# Patient Record
Sex: Female | Born: 2000 | Race: White | Hispanic: No | Marital: Single | Smoking: Never smoker
Health system: Southern US, Community
[De-identification: ages and names within clinical notes are randomized; demographics above are authoritative.]

## PROBLEM LIST (undated history)

## (undated) DIAGNOSIS — N390 Urinary tract infection, site not specified: Secondary | ICD-10-CM

---
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
## 2021-07-01 ENCOUNTER — Encounter (HOSPITAL_COMMUNITY): Payer: Self-pay | Admitting: Emergency Medicine

## 2021-07-01 ENCOUNTER — Emergency Department (HOSPITAL_COMMUNITY)
Admission: EM | Admit: 2021-07-01 | Discharge: 2021-07-01 | Disposition: A | Discharge status: Home / Self Care | Payer: 59 | Attending: Emergency Medicine | Admitting: Emergency Medicine

## 2021-07-01 ENCOUNTER — Other Ambulatory Visit: Payer: Self-pay

## 2021-07-01 ENCOUNTER — Emergency Department (HOSPITAL_COMMUNITY): Payer: 59

## 2021-07-01 DIAGNOSIS — N201 Calculus of ureter: Secondary | ICD-10-CM | POA: Insufficient documentation

## 2021-07-01 DIAGNOSIS — R109 Unspecified abdominal pain: Secondary | ICD-10-CM | POA: Diagnosis present

## 2021-07-01 DIAGNOSIS — N200 Calculus of kidney: Secondary | ICD-10-CM

## 2021-07-01 LAB — CBC
HCT: 43.3 % (ref 36.0–46.0)
Hemoglobin: 14.5 g/dL (ref 12.0–15.0)
MCH: 31 pg (ref 26.0–34.0)
MCHC: 33.5 g/dL (ref 30.0–36.0)
MCV: 92.7 fL (ref 80.0–100.0)
Platelets: 334 10*3/uL (ref 150–400)
RBC: 4.67 MIL/uL (ref 3.87–5.11)
RDW: 11.9 % (ref 11.5–15.5)
WBC: 11.4 10*3/uL — ABNORMAL HIGH (ref 4.0–10.5)
nRBC: 0 % (ref 0.0–0.2)

## 2021-07-01 LAB — LIPASE, BLOOD: Lipase: 24 U/L (ref 11–51)

## 2021-07-01 LAB — URINALYSIS, ROUTINE W REFLEX MICROSCOPIC
Bilirubin Urine: NEGATIVE
Glucose, UA: NEGATIVE mg/dL
Ketones, ur: NEGATIVE mg/dL
Leukocytes,Ua: NEGATIVE
Nitrite: NEGATIVE
Protein, ur: 100 mg/dL — AB
RBC / HPF: >50 RBC/hpf — ABNORMAL HIGH (ref 0–5)
Specific Gravity, Urine: 1.03 (ref 1.005–1.030)
pH: 5 (ref 5.0–8.0)

## 2021-07-01 LAB — HEPATIC FUNCTION PANEL
ALT: 14 U/L (ref 0–44)
AST: 16 U/L (ref 15–41)
Albumin: 4.3 g/dL (ref 3.5–5.0)
Alkaline Phosphatase: 88 U/L (ref 38–126)
Bilirubin, Direct: <0.1 mg/dL (ref 0.0–0.2)
Total Bilirubin: 0.5 mg/dL (ref 0.3–1.2)
Total Protein: 7.4 g/dL (ref 6.5–8.1)

## 2021-07-01 LAB — I-STAT BETA HCG BLOOD, ED (MC, WL, AP ONLY): I-stat hCG, quantitative: <5 m[IU]/mL (ref <5)

## 2021-07-01 LAB — BASIC METABOLIC PANEL
Anion gap: 12 (ref 5–15)
BUN: 9 mg/dL (ref 6–20)
CO2: 19 mmol/L — ABNORMAL LOW (ref 22–32)
Calcium: 9.6 mg/dL (ref 8.9–10.3)
Chloride: 106 mmol/L (ref 98–111)
Creatinine, Ser: 0.73 mg/dL (ref 0.44–1.00)
GFR, Estimated: >60 mL/min (ref >60)
Glucose, Bld: 153 mg/dL — ABNORMAL HIGH (ref 70–99)
Potassium: 3.5 mmol/L (ref 3.5–5.1)
Sodium: 137 mmol/L (ref 135–145)

## 2021-07-01 MED ORDER — MORPHINE SULFATE (PF) 4 MG/ML IV SOLN
4.0000 mg | Freq: Once | INTRAVENOUS | Status: AC
  Administered 2021-07-01: 09:00:00 4 mg via INTRAVENOUS
  Filled 2021-07-01: qty 1

## 2021-07-01 MED ORDER — KETOROLAC TROMETHAMINE 30 MG/ML IJ SOLN
30.0000 mg | Freq: Once | INTRAMUSCULAR | Status: AC
  Administered 2021-07-01: 11:00:00 30 mg via INTRAVENOUS
  Filled 2021-07-01: qty 1

## 2021-07-01 MED ORDER — ONDANSETRON 8 MG PO TBDP: 8.0000 mg | ORAL_TABLET | Freq: Three times a day (TID) | ORAL | 0 refills | Status: AC | PRN

## 2021-07-01 MED ORDER — NAPROXEN 375 MG PO TABS: 375.0000 mg | ORAL_TABLET | Freq: Two times a day (BID) | ORAL | 0 refills | Status: AC

## 2021-07-01 MED ORDER — ONDANSETRON HCL 4 MG/2ML IJ SOLN
4.0000 mg | Freq: Once | INTRAMUSCULAR | Status: AC
  Administered 2021-07-01: 09:00:00 4 mg via INTRAVENOUS
  Filled 2021-07-01: qty 2

## 2021-07-01 MED ORDER — HYDROCODONE-ACETAMINOPHEN 5-325 MG PO TABS: 1.0000 | ORAL_TABLET | Freq: Four times a day (QID) | ORAL | 0 refills | Status: AC | PRN

## 2021-07-01 NOTE — Discharge Instructions (Signed)
Use a urine strainer to help determine if you passed the kidney stone.  Take the medications as needed for pain and nausea.  Return to the emergency room for worsening symptoms.  Follow-up with a urologist for further treatment.

## 2021-07-01 NOTE — ED Triage Notes (Signed)
Pt reports sudden onset of right flank pain this AM. Reports hx of kidney stones. Appears uncomfortable. Vomiting in triage.

## 2021-07-01 NOTE — ED Provider Notes (Signed)
[facility] EMERGENCY DEPARTMENT Provider Note   CSN: [id number] Arrival date & time: 07/01/21  0809     History Chief Complaint  Patient presents with   Flank Pain    Katherine Potter is a 20 y.o. female.   Flank Pain   Patient presents with acute right-sided flank pain that started this morning.  Patient states she is having severe pain that is worse than she has had in the past.  Pain moves towards her abdomen.  She is having nausea and vomiting.  She also has felt chilled although has not measured any fevers.  Patient states he has a history of kidney stones and this does feel similar.  She has had no prior abdominal problems.  Past Medical History:  Diagnosis Date   UTI (urinary tract infection)     There are no problems to display for this patient.   History reviewed. No pertinent surgical history.   OB History   No obstetric history on file.     History reviewed. No pertinent family history.  Social History   Tobacco Use   Smoking status: Never   Smokeless tobacco: Never  Substance Use Topics   Alcohol use: Yes    Home Medications Prior to Admission medications   Medication Sig Start Date End Date Taking? Authorizing Provider  HYDROcodone-acetaminophen (NORCO/VICODIN) 5-325 MG tablet Take 1 tablet by mouth every 6 (six) hours as needed. 07/01/21  Yes Linwood Dibbles, MD  naproxen (NAPROSYN) 375 MG tablet Take 1 tablet (375 mg total) by mouth 2 (two) times daily. 07/01/21  Yes Linwood Dibbles, MD  ondansetron (ZOFRAN-ODT) 8 MG disintegrating tablet Take 1 tablet (8 mg total) by mouth every 8 (eight) hours as needed for nausea or vomiting. 07/01/21  Yes Linwood Dibbles, MD    Allergies    Penicillins  Review of Systems   Review of Systems  Genitourinary:  Positive for flank pain.  All other systems reviewed and are negative.  Physical Exam Updated Vital Signs BP 106/75    Pulse (!) 57    Temp 98.6 F (37 C)    Resp 16    Ht 1.626 m (5\' 4" )     Wt 64.4 kg    LMP  (LMP Unknown)    SpO2 100%    BMI 24.37 kg/m   Physical Exam Vitals and nursing note reviewed.  Constitutional:      General: She is in acute distress.     Appearance: She is well-developed. She is ill-appearing.  HENT:     Head: Normocephalic and atraumatic.     Right Ear: External ear normal.     Left Ear: External ear normal.  Eyes:     General: No scleral icterus.       Right eye: No discharge.        Left eye: No discharge.     Conjunctiva/sclera: Conjunctivae normal.  Neck:     Trachea: No tracheal deviation.  Cardiovascular:     Rate and Rhythm: Normal rate and regular rhythm.  Pulmonary:     Effort: Pulmonary effort is normal. No respiratory distress.     Breath sounds: Normal breath sounds. No stridor. No wheezing or rales.  Abdominal:     General: Bowel sounds are normal. There is no distension.     Palpations: Abdomen is soft.     Tenderness: There is abdominal tenderness. There is no guarding or rebound.  Musculoskeletal:        General: No tenderness  or deformity.     Cervical back: Neck supple.  Skin:    General: Skin is warm and dry.     Findings: No rash.  Neurological:     General: No focal deficit present.     Mental Status: She is alert.     Cranial Nerves: No cranial nerve deficit (no facial droop, extraocular movements intact, no slurred speech).     Sensory: No sensory deficit.     Motor: No abnormal muscle tone or seizure activity.     Coordination: Coordination normal.  Psychiatric:        Mood and Affect: Mood normal.    ED Results / Procedures / Treatments   Labs (all labs ordered are listed, but only abnormal results are displayed) Labs Reviewed  URINALYSIS, ROUTINE W REFLEX MICROSCOPIC - Abnormal; Notable for the following components:      Result Value   Color, Urine AMBER (*)    APPearance CLOUDY (*)    Hgb urine dipstick LARGE (*)    Protein, ur 100 (*)    RBC / HPF >50 (*)    Bacteria, UA MANY (*)    All  other components within normal limits  CBC - Abnormal; Notable for the following components:   WBC 11.4 (*)    All other components within normal limits  BASIC METABOLIC PANEL - Abnormal; Notable for the following components:   CO2 19 (*)    Glucose, Bld 153 (*)    All other components within normal limits  HEPATIC FUNCTION PANEL  LIPASE, BLOOD  I-STAT BETA HCG BLOOD, ED ([facility], [facility], [facility] ONLY)    EKG None  Radiology CT Renal Stone Study  Result Date: 07/01/2021 CLINICAL DATA:  Acute right flank pain. EXAM: CT ABDOMEN AND PELVIS WITHOUT CONTRAST TECHNIQUE: Multidetector CT imaging of the abdomen and pelvis was performed following the standard protocol without IV contrast. COMPARISON:  None. FINDINGS: Lower chest: No acute abnormality. Hepatobiliary: No focal liver abnormality is seen. No gallstones, gallbladder wall thickening, or biliary dilatation. Pancreas: Unremarkable. No pancreatic ductal dilatation or surrounding inflammatory changes. Spleen: Normal in size without focal abnormality. Adrenals/Urinary Tract: Adrenal glands appear normal. Small nonobstructive left renal calculus is noted. Mild right hydroureteronephrosis is noted secondary to 3 mm calculus in distal right ureter. Urinary bladder is decompressed. Stomach/Bowel: Stomach is within normal limits. Appendix appears normal. No evidence of bowel wall thickening, distention, or inflammatory changes. Vascular/Lymphatic: No significant vascular findings are present. No enlarged abdominal or pelvic lymph nodes. Reproductive: Uterus and bilateral adnexa are unremarkable. Other: No abdominal wall hernia or abnormality. No abdominopelvic ascites. Musculoskeletal: Bilateral L5 spondylolysis is noted. No acute osseous abnormality is noted. IMPRESSION: Mild right hydroureteronephrosis is noted secondary to 3 mm calculus in distal right ureter. Small nonobstructive left renal calculus. Bilateral L5 spondylolysis. Electronically Signed   By: Lupita Raider M.D.   On: 07/01/2021 10:06    Procedures Procedures   Medications Ordered in ED Medications  morphine 4 MG/ML injection 4 mg (4 mg Intravenous Given 07/01/21 0923)  ondansetron (ZOFRAN) injection 4 mg (4 mg Intravenous Given 07/01/21 0923)  ketorolac (TORADOL) 30 MG/ML injection 30 mg (30 mg Intravenous Given 07/01/21 1039)    ED Course  I have reviewed the triage vital signs and the nursing notes.  Pertinent labs & imaging results that were available during my care of the patient were reviewed by me and considered in my medical decision making (see chart for details).  Clinical Course as of  07/01/21 1334  Fri Jul 01, 2021  1306 Urinalysis shows many bacteria and squamous cells but not suggestive of infection [JK]  1307 CBC metabolic panel unremarkable. [JK]  1307 [place] function panel and lipase normal [JK]  1307 CT scan shows 3 mm ureteral stone [JK]    Clinical Course User Index [JK] Linwood Dibbles, MD   MDM Rules/Calculators/A&P                         Patient presented with acute flank pain.  History of previous kidney stones.  CT scan confirms right-sided ureteral stone.  Patient was treated with IV pain medications and antiemetics.  Symptoms have improved.  Will discharge home with prescription for pain medications and recommend outpatient follow-up with urology.      Final Clinical Impression(s) / ED Diagnoses Final diagnoses:  None    Rx / DC Orders ED Discharge Orders          Ordered    naproxen (NAPROSYN) 375 MG tablet  2 times daily        07/01/21 1330    HYDROcodone-acetaminophen (NORCO/VICODIN) 5-325 MG tablet  Every 6 hours PRN        07/01/21 1330    ondansetron (ZOFRAN-ODT) 8 MG disintegrating tablet  Every 8 hours PRN        07/01/21 1330             Linwood Dibbles, MD 07/01/21 1334

## 2021-07-01 NOTE — ED Notes (Signed)
Pt verbalized understanding of discharge paperwork, prescriptions and follow-up care 

## 2023-05-27 IMAGING — CT CT RENAL STONE PROTOCOL
2 of 4 series · 16 of 46 positions shown, 18 images · non-contrast
Comparison: None.

CLINICAL DATA: Acute right flank pain.

EXAM:
CT ABDOMEN AND PELVIS WITHOUT CONTRAST
TECHNIQUE: Multidetector CT imaging of the abdomen and pelvis was performed
following the standard protocol without IV contrast.

[Series 3: renal stone 5.0 · axial · 0.84mm/px · z∈[+707,+1147]mm · 13 of 96 slices shown, 15 images]
[im 4/96  soft-tissue]
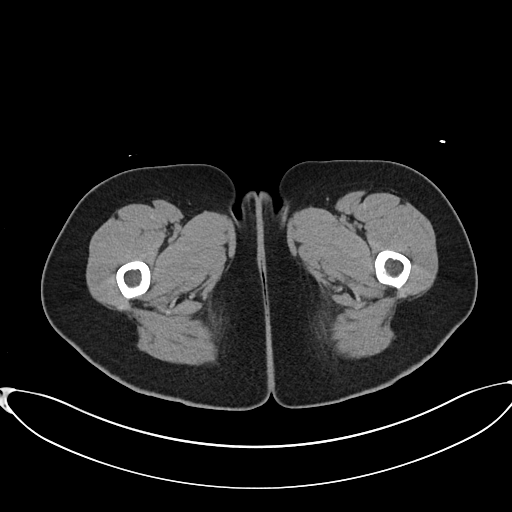
[im 4/96  bone]
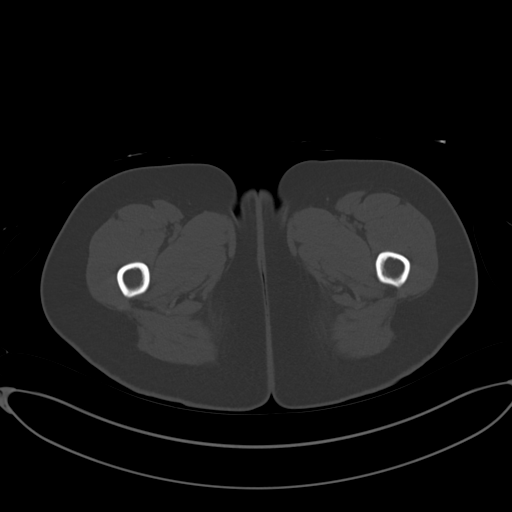
[im 12/96  soft-tissue]
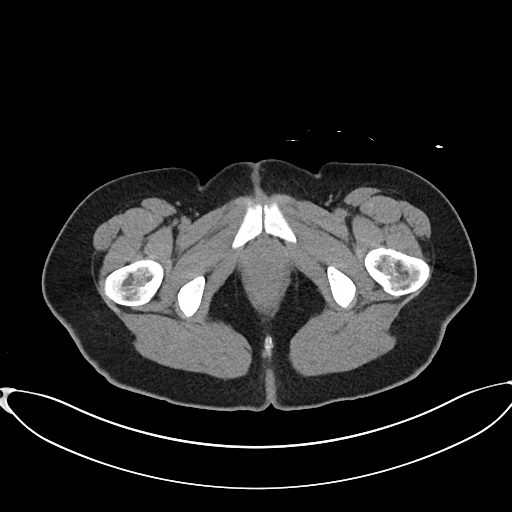
[im 20/96  soft-tissue]
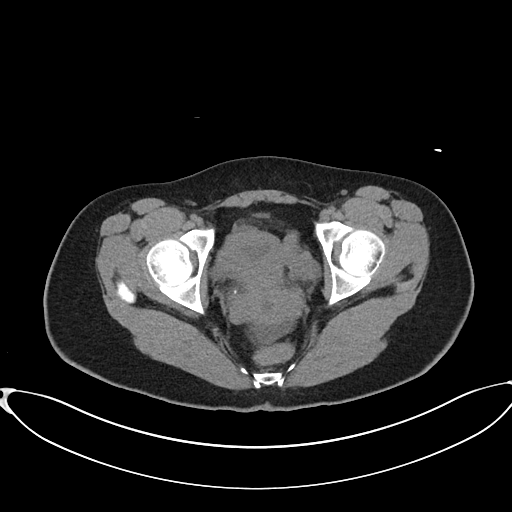
[im 27/96  soft-tissue]
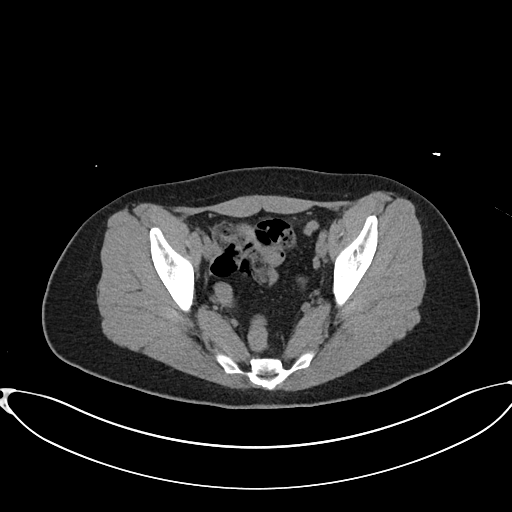
[im 35/96  soft-tissue]
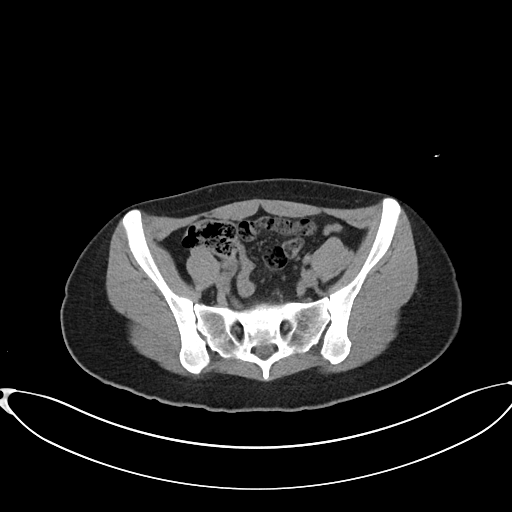
[im 42/96  soft-tissue]
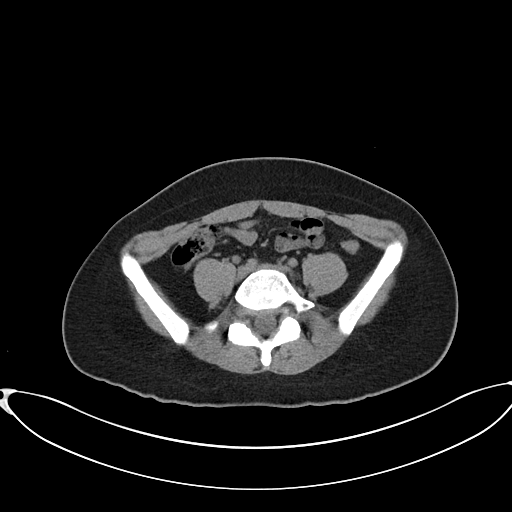
[im 50/96  soft-tissue]
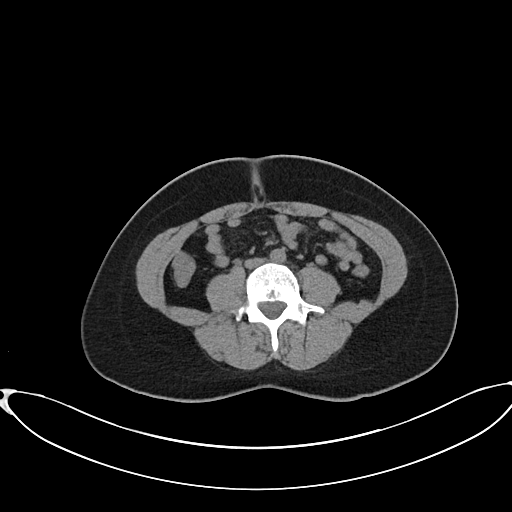
[im 54/96  soft-tissue]
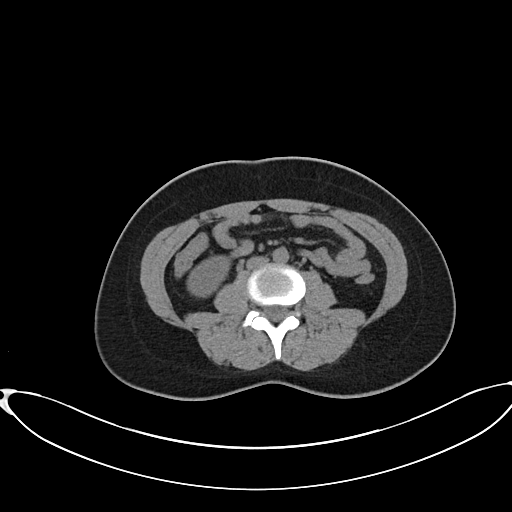
[im 61/96  soft-tissue]
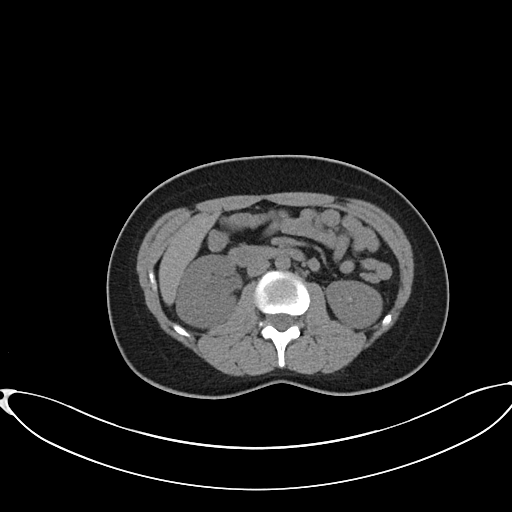
[im 61/96  bone]
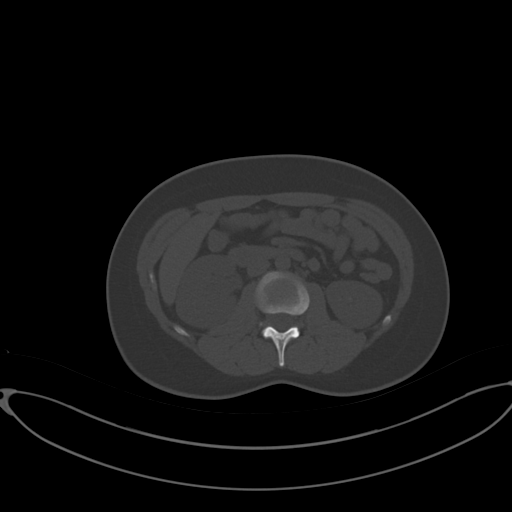
[im 69/96  soft-tissue]
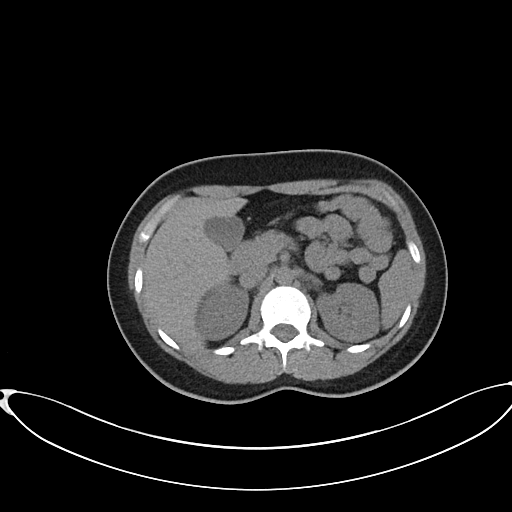
[im 77/96  soft-tissue]
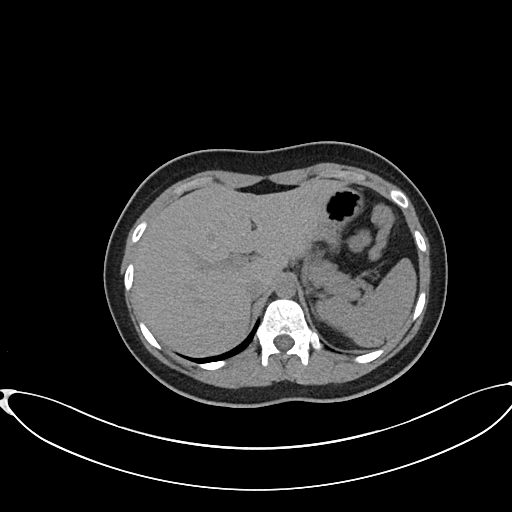
[im 84/96  soft-tissue]
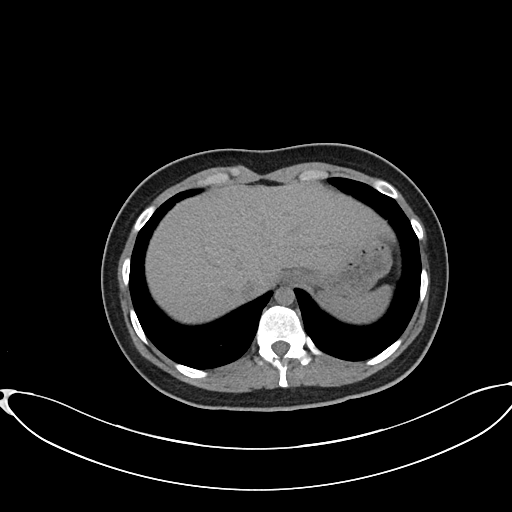
[im 92/96  soft-tissue]
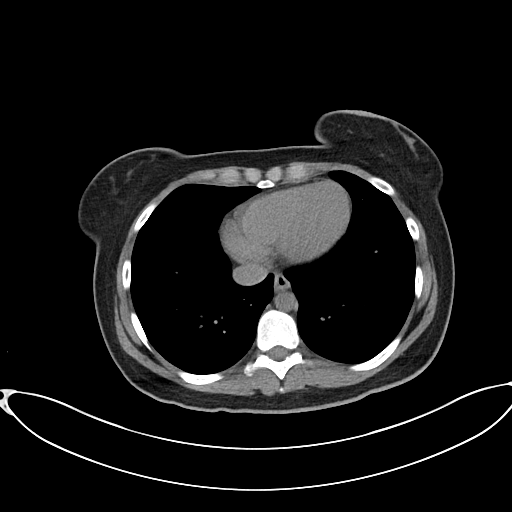

[Series 6: cor · coronal · 0.65mm/px · 3 of 150 slices shown]
[im 50/150  soft-tissue]
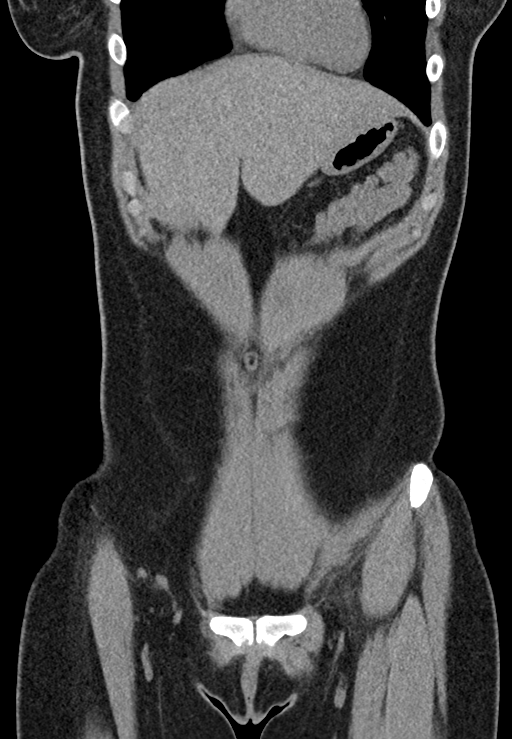
[im 67/150  soft-tissue]
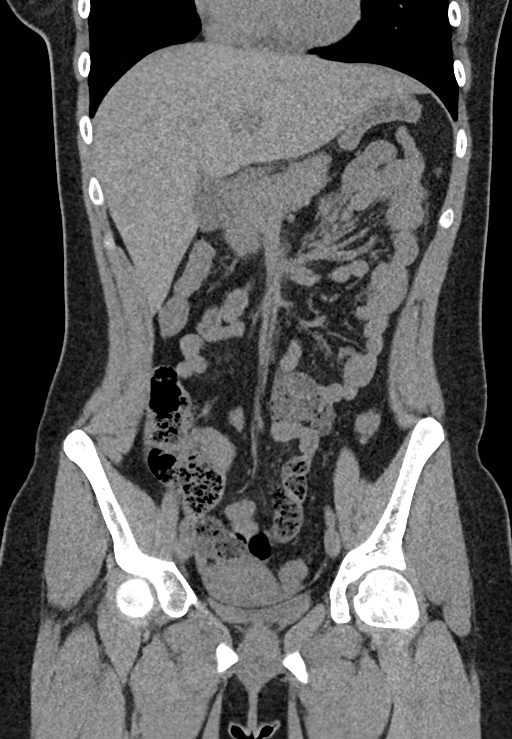
[im 83/150  soft-tissue]
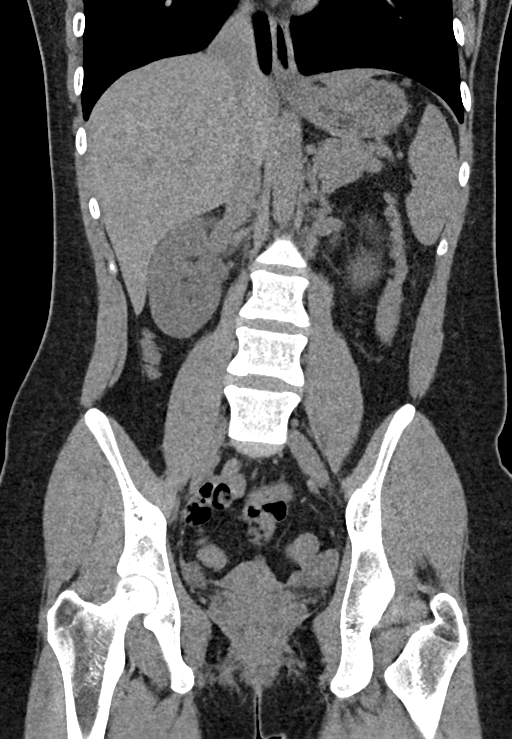

[16 of 46 positions shown; findings below may reference images not displayed]

FINDINGS: Lower chest: No acute abnormality.

Hepatobiliary: No focal liver abnormality is seen. No gallstones,
gallbladder wall thickening, or biliary dilatation.

Pancreas: Unremarkable. No pancreatic ductal dilatation or
surrounding inflammatory changes.

Spleen: Normal in size without focal abnormality.

Adrenals/Urinary Tract: Adrenal glands appear normal. Small
nonobstructive left renal calculus is noted. Mild right
hydroureteronephrosis is noted secondary to 3 mm calculus in distal
right ureter. Urinary bladder is decompressed.

Stomach/Bowel: Stomach is within normal limits. Appendix appears
normal. No evidence of bowel wall thickening, distention, or
inflammatory changes.

Vascular/Lymphatic: No significant vascular findings are present. No
enlarged abdominal or pelvic lymph nodes.

Reproductive: Uterus and bilateral adnexa are unremarkable.

Other: No abdominal wall hernia or abnormality. No abdominopelvic
ascites.

Musculoskeletal: Bilateral L5 spondylolysis is noted. No acute
osseous abnormality is noted.
IMPRESSION: Mild right hydroureteronephrosis is noted secondary to 3 mm calculus
in distal right ureter.

Small nonobstructive left renal calculus.

Bilateral L5 spondylolysis.
